# Patient Record
Sex: Male | Born: 2000 | Race: White | Hispanic: No | Marital: Single | Smoking: Never smoker
Health system: Southern US, Community
[De-identification: ages and names within clinical notes are randomized; demographics above are authoritative.]

## PROBLEM LIST (undated history)

## (undated) DIAGNOSIS — F909 Attention-deficit hyperactivity disorder, unspecified type: Secondary | ICD-10-CM

## (undated) DIAGNOSIS — R569 Unspecified convulsions: Secondary | ICD-10-CM

## (undated) DIAGNOSIS — F840 Autistic disorder: Secondary | ICD-10-CM

## (undated) DIAGNOSIS — F4310 Post-traumatic stress disorder, unspecified: Secondary | ICD-10-CM

## (undated) DIAGNOSIS — G939 Disorder of brain, unspecified: Secondary | ICD-10-CM

## (undated) DIAGNOSIS — G43909 Migraine, unspecified, not intractable, without status migrainosus: Secondary | ICD-10-CM

## (undated) HISTORY — DX: Post-traumatic stress disorder, unspecified: F43.10

## (undated) HISTORY — DX: Autistic disorder: F84.0

## (undated) HISTORY — PX: BRAIN SURGERY: SHX531

## (undated) HISTORY — DX: Attention-deficit hyperactivity disorder, unspecified type: F90.9

---
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
## 2022-03-30 NOTE — Progress Notes (Unsigned)
[facility] [place]  ASSESSMENT / PLAN: 21 y.o. male with atypical blood pressure readings, and an episode of near syncope with raising his right arm over his head.  He has a normal physical exam in office today.  His noninvasive vascular labs are normal.  I counseled the mother that given his lack of risk factors she has of him having subclavian steal syndrome or meaningful arterial disease is close to 0%.  He can follow-up with me as needed.  CHIEF COMPLAINT: Near syncope with raising right arm overhead  HISTORY OF PRESENT ILLNESS: Christian Watkins is a 21 y.o. male who has severe autism spectrum disorder.  He is nonverbal but quite pleasant and interactive.  His mother is with him today as is his support dog.  The patient's mother reports during a recent therapy session, he was extending and abducting his right shoulder, which is an activity he usually does not do.  This caused an episode of near syncope.  Blood pressure measurements were made in both arms and his primary care physician's office and these were nonsymmetric.  Of note, the patient is constantly moving and does not sit still very well.  Measuring his blood pressure was somewhat challenging in our office today.  Past Medical History:  Diagnosis Date   ADHD    Autism    Brain damage    Constipation    Migraines    PTSD (post-traumatic stress disorder)    Seizure (HCC)    Seizures (HCC)     Past Surgical History:  Procedure Laterality Date   BRAIN SURGERY      Family History  Adopted: Yes    Social History   Socioeconomic History   Marital status: Single    Spouse name: Not on file   Number of children: Not on file   Years of education: Not on file   Highest education level: Not on file  Occupational History   Not on file  Tobacco Use   Smoking status: Never   Smokeless tobacco: Never  Substance and Sexual Activity   Alcohol use: No   Drug use: No   Sexual activity: Never  Other  Topics Concern   Not on file  Social History Narrative   Not on file   Social Determinants of Health   Financial Resource Strain: Not on file  Food Insecurity: Not on file  Transportation Needs: Not on file  Physical Activity: Not on file  Stress: Not on file  Social Connections: Not on file  Intimate Partner Violence: Not on file    Allergies  Allergen Reactions   Acetaminophen Hives, Rash and Other (See Comments)    Possibly reacted to the red dye in medication    Amphetamine-Dextroamphetamine Other (See Comments)     "Extreme aggression" and Agitation   Clonidine Hcl Other (See Comments)    Agitation and Extreme aggression   Lamotrigine Anxiety and Other (See Comments)    MUST HAVE BRAND NAME TEVA- Cannot have a certain brand- Markings on pill is 228 white oval shape (caused extreme aggression and agitation) Extreme aggression with generics CAN tolerate Teva brand   Lorazepam Nausea And Vomiting, Anxiety and Other (See Comments)    Mother report dosing causes agitation and hyperactivity, "bounces off the walls" and caused projectile vomiting    Red Dye Rash and Other (See Comments)    Red children's Tylenol- Unsure if red dye reaction or tylenol reaction    Topiramate Other (See Comments)  Tremors     Augmentin [Amoxicillin-Pot Clavulanate] Nausea And Vomiting   Cyproheptadine Other (See Comments)    Mental status change- made the patient "very aggressive"   Guanfacine Other (See Comments)    Mental Status Change- aggression   Kapvay [Clonidine] Other (See Comments)    Caused the patient to feel aggressive   Ritalin [Methylphenidate Hcl] Other (See Comments)    "Aggressive screaming."   Trileptal [Oxcarbazepine] Other (See Comments)    "became very mean" and this increased seizure activity   Amoxicillin Hives and Rash    Current Outpatient Medications  Medication Sig Dispense Refill   ARIPiprazole (ABILIFY) 10 MG tablet Take 10-20 mg by mouth See admin  instructions. Take 20 mg by mouth in the morning and 10 mg in the evening     atomoxetine (STRATTERA) 100 MG capsule Take 100 mg by mouth in the morning.     busPIRone (BUSPAR) 10 MG tablet Take 10 mg by mouth 3 (three) times daily.     Cholecalciferol (VITAMIN D3 GUMMIES PO) Take 2 tablets by mouth daily.     clindamycin (CLEOCIN) 150 MG capsule Take 1 capsule (150 mg total) by mouth daily.     diphenhydrAMINE (BENADRYL) 12.5 MG/5ML elixir Take 12.5 mg by mouth 4 (four) times daily as needed (AS DIRECTED for headaches or migraines- component of migraine cocktail).     hydrOXYzine (VISTARIL) 25 MG capsule Take 50 mg by mouth See admin instructions. Take 50 mg by mouth in the morning, at lunchtime, and in the evening- may also take either an additional 25 mg three times a day OR 75 mg once a day as needed for anxiety     lamoTRIgine (LAMICTAL) 25 MG tablet Take 100 mg by mouth See admin instructions. Take 100 mg by mouth in the morning and at lunchtime     loratadine (CLARITIN) 10 MG tablet Take 10 mg by mouth daily.     Melatonin 10 MG TABS Take 20 mg by mouth at bedtime.     NAYZILAM 5 MG/0.1ML SOLN Place 5 mg into the nose as needed (for a seizure >5 minutes).     omeprazole (PRILOSEC) 20 MG capsule Take 20 mg by mouth See admin instructions. Take 20 mg by mouth in the morning and evening     oxymetazoline (AFRIN) 0.05 % nasal spray Place 1 spray into both nostrils 2 (two) times daily as needed (Nose bleeding). 30 mL 0   Pediatric Multivit-Minerals-C (KIDS GUMMY BEAR VITAMINS PO) Take 1 tablet by mouth 2 (two) times daily.     prochlorperazine (COMPAZINE) 10 MG tablet Take 10 mg by mouth every 8 (eight) hours as needed for nausea or vomiting.     traZODone (DESYREL) 50 MG tablet Take 50 mg by mouth at bedtime.     No current facility-administered medications for this visit.    PHYSICAL EXAM Vitals:   03/31/22 1041  BP: 117/83  Pulse: 82  Resp: 20  SpO2: 97%  Weight: 94 lb (42.6 kg)   Height: 5\' 3"  (1.6 m)    Constitutional: Developmentally delayed young man.  Very pleasant and interactive.. Neurologic: Multiple complex tics and repetitive behavior throughout the exam.   Psychiatric: Nonverbal.  Pleasant and interactive. Cardiac: Regular rate and rhythm.  Respiratory:  unlabored. Peripheral vascular: 2+ brachial and radial artery pulses bilaterally  PERTINENT LABORATORY AND RADIOLOGIC DATA  Most recent CBC    Latest Ref Rng & Units 02/26/2021    2:27 AM 02/25/2021   11:01  AM 02/25/2021    3:54 AM  CBC  WBC 4.0 - 10.5 K/uL 7.1     Hemoglobin 13.0 - 17.0 g/dL 8.0  8.3  6.1   Hematocrit 39.0 - 52.0 % 23.9  24.3  19.2   Platelets 150 - 400 K/uL 232        Most recent CMP    Latest Ref Rng & Units 02/26/2021    2:27 AM 02/25/2021   12:31 AM 02/24/2021   12:46 AM  CMP  Glucose 70 - 99 mg/dL 93  78  93   BUN 6 - 20 mg/dL 7  11  28    Creatinine 0.61 - 1.24 mg/dL 0.72  0.68  0.60   Sodium 135 - 145 mmol/L 139  137  138   Potassium 3.5 - 5.1 mmol/L 4.2  4.1  3.8   Chloride 98 - 111 mmol/L 107  107  108   CO2 22 - 32 mmol/L 27  26  26    Calcium 8.9 - 10.3 mg/dL 8.8  8.0  8.3   Total Protein 6.5 - 8.1 g/dL   4.6   Total Bilirubin 0.3 - 1.2 mg/dL   0.4   Alkaline Phos 38 - 126 U/L   108   AST 15 - 41 U/L   15   ALT 0 - 44 U/L   11    Wrist brachial index     Right: No significant arterial obstruction detected in the right         upper extremity.  Left: No significant arterial obstruction detected in the left upper        extremity.   Yevonne Aline. Stanford Breed, MD [facility] Phone Number: [phone] 03/30/2022 4:08 PM  Total time spent on preparing this encounter including chart review, data review, collecting history, examining the patient, coordinating care for this new patient, 45 minutes.  Portions of this report may have been transcribed using voice recognition software.  Every effort has been made to ensure  accuracy; however, inadvertent computerized transcription errors may still be present.

## 2022-03-31 ENCOUNTER — Ambulatory Visit (INDEPENDENT_AMBULATORY_CARE_PROVIDER_SITE_OTHER): Payer: Medicaid Other | Admitting: Vascular Surgery

## 2022-03-31 ENCOUNTER — Other Ambulatory Visit: Payer: Self-pay

## 2022-03-31 ENCOUNTER — Encounter: Payer: Self-pay | Admitting: Vascular Surgery

## 2022-03-31 ENCOUNTER — Ambulatory Visit (HOSPITAL_COMMUNITY)
Admission: RE | Admit: 2022-03-31 | Discharge: 2022-03-31 | Disposition: A | Discharge status: Home / Self Care | Payer: Medicaid Other | Source: Ambulatory Visit | Attending: Vascular Surgery | Admitting: Vascular Surgery

## 2022-03-31 VITALS — BP 117/83 | HR 82 | Resp 20 | Ht 63.0 in | Wt 94.0 lb

## 2022-03-31 DIAGNOSIS — R55 Syncope and collapse: Secondary | ICD-10-CM

## 2022-03-31 DIAGNOSIS — F840 Autistic disorder: Secondary | ICD-10-CM
